# Patient Record
Sex: Male | Born: 1990 | Race: White | Hispanic: No | Marital: Single | Smoking: Never smoker
Health system: Southern US, Community
[De-identification: ages and names within clinical notes are randomized; demographics above are authoritative.]

## PROBLEM LIST (undated history)

## (undated) HISTORY — PX: OTHER SURGICAL HISTORY: SHX169

---

## 2014-03-19 ENCOUNTER — Emergency Department (HOSPITAL_COMMUNITY): Payer: BC Managed Care – PPO

## 2014-03-19 ENCOUNTER — Encounter (HOSPITAL_COMMUNITY): Payer: Self-pay | Admitting: Emergency Medicine

## 2014-03-19 ENCOUNTER — Ambulatory Visit (INDEPENDENT_AMBULATORY_CARE_PROVIDER_SITE_OTHER): Payer: BC Managed Care – PPO | Admitting: Emergency Medicine

## 2014-03-19 ENCOUNTER — Inpatient Hospital Stay (HOSPITAL_COMMUNITY)
Admission: EM | Admit: 2014-03-19 | Discharge: 2014-03-22 | DRG: 699 | Disposition: A | Discharge status: Home / Self Care | Payer: BC Managed Care – PPO | Attending: General Surgery | Admitting: General Surgery

## 2014-03-19 VITALS — BP 108/68 | HR 66 | Temp 97.4°F | Resp 16 | Ht 67.5 in | Wt 147.0 lb

## 2014-03-19 DIAGNOSIS — R1012 Left upper quadrant pain: Secondary | ICD-10-CM

## 2014-03-19 DIAGNOSIS — S37009A Unspecified injury of unspecified kidney, initial encounter: Secondary | ICD-10-CM

## 2014-03-19 DIAGNOSIS — S37032A Laceration of left kidney, unspecified degree, initial encounter: Secondary | ICD-10-CM

## 2014-03-19 DIAGNOSIS — R319 Hematuria, unspecified: Secondary | ICD-10-CM | POA: Diagnosis present

## 2014-03-19 DIAGNOSIS — Y9379 Activity, other specified sports and athletics: Secondary | ICD-10-CM

## 2014-03-19 DIAGNOSIS — D62 Acute posthemorrhagic anemia: Secondary | ICD-10-CM

## 2014-03-19 DIAGNOSIS — S37039A Laceration of unspecified kidney, unspecified degree, initial encounter: Principal | ICD-10-CM

## 2014-03-19 DIAGNOSIS — D72829 Elevated white blood cell count, unspecified: Secondary | ICD-10-CM | POA: Diagnosis present

## 2014-03-19 DIAGNOSIS — W219XXA Striking against or struck by unspecified sports equipment, initial encounter: Secondary | ICD-10-CM

## 2014-03-19 LAB — I-STAT CHEM 8, ED
BUN: 16 mg/dL (ref 6–23)
CHLORIDE: 102 meq/L (ref 96–112)
Calcium, Ion: 1.22 mmol/L (ref 1.12–1.23)
Creatinine, Ser: 1 mg/dL (ref 0.50–1.35)
GLUCOSE: 93 mg/dL (ref 70–99)
HEMATOCRIT: 33 % — AB (ref 39.0–52.0)
Hemoglobin: 11.2 g/dL — ABNORMAL LOW (ref 13.0–17.0)
POTASSIUM: 4.1 meq/L (ref 3.7–5.3)
Sodium: 140 mEq/L (ref 137–147)
TCO2: 23 mmol/L (ref 0–100)

## 2014-03-19 LAB — POCT URINALYSIS DIPSTICK
GLUCOSE UA: NEGATIVE
Ketones, UA: 40
LEUKOCYTES UA: NEGATIVE
NITRITE UA: NEGATIVE
Protein, UA: >=300
Spec Grav, UA: 1.025
Urobilinogen, UA: 1
pH, UA: 6

## 2014-03-19 LAB — COMPREHENSIVE METABOLIC PANEL
ALK PHOS: 41 U/L (ref 39–117)
ALT: 11 U/L (ref 0–53)
AST: 19 U/L (ref 0–37)
Albumin: 4.1 g/dL (ref 3.5–5.2)
BILIRUBIN TOTAL: 0.6 mg/dL (ref 0.3–1.2)
BUN: 16 mg/dL (ref 6–23)
CHLORIDE: 103 meq/L (ref 96–112)
CO2: 23 meq/L (ref 19–32)
Calcium: 9 mg/dL (ref 8.4–10.5)
Creatinine, Ser: 0.98 mg/dL (ref 0.50–1.35)
GFR calc Af Amer: >90 mL/min (ref >90)
Glucose, Bld: 97 mg/dL (ref 70–99)
POTASSIUM: 4.2 meq/L (ref 3.7–5.3)
Sodium: 137 mEq/L (ref 137–147)
Total Protein: 6.4 g/dL (ref 6.0–8.3)

## 2014-03-19 LAB — POCT UA - MICROSCOPIC ONLY

## 2014-03-19 LAB — POCT CBC
Granulocyte percent: 87.6 %G — AB (ref 37–80)
HCT, POC: 38.4 % — AB (ref 43.5–53.7)
HEMOGLOBIN: 12.6 g/dL — AB (ref 14.1–18.1)
Lymph, poc: 1.6 (ref 0.6–3.4)
MCH, POC: 28.5 pg (ref 27–31.2)
MCHC: 32.8 g/dL (ref 31.8–35.4)
MCV: 86.8 fL (ref 80–97)
MID (cbc): 0.9 (ref 0–0.9)
MPV: 9.5 fL (ref 0–99.8)
POC GRANULOCYTE: 17 — AB (ref 2–6.9)
POC LYMPH %: 8 % — AB (ref 10–50)
POC MID %: 4.4 % (ref 0–12)
Platelet Count, POC: 258 10*3/uL (ref 142–424)
RBC: 4.42 M/uL — AB (ref 4.69–6.13)
RDW, POC: 13.5 %
WBC: 19.4 10*3/uL — AB (ref 4.6–10.2)

## 2014-03-19 LAB — CBC WITH DIFFERENTIAL/PLATELET
BASOS PCT: 0 % (ref 0–1)
Basophils Absolute: 0 10*3/uL (ref 0.0–0.1)
Eosinophils Absolute: 0 10*3/uL (ref 0.0–0.7)
Eosinophils Relative: 0 % (ref 0–5)
HCT: 33.6 % — ABNORMAL LOW (ref 39.0–52.0)
HEMOGLOBIN: 11.3 g/dL — AB (ref 13.0–17.0)
Lymphocytes Relative: 6 % — ABNORMAL LOW (ref 12–46)
Lymphs Abs: 0.9 10*3/uL (ref 0.7–4.0)
MCH: 28.5 pg (ref 26.0–34.0)
MCHC: 33.6 g/dL (ref 30.0–36.0)
MCV: 84.6 fL (ref 78.0–100.0)
MONOS PCT: 5 % (ref 3–12)
Monocytes Absolute: 0.8 10*3/uL (ref 0.1–1.0)
NEUTROS ABS: 14 10*3/uL — AB (ref 1.7–7.7)
NEUTROS PCT: 89 % — AB (ref 43–77)
Platelets: 201 10*3/uL (ref 150–400)
RBC: 3.97 MIL/uL — ABNORMAL LOW (ref 4.22–5.81)
RDW: 12.8 % (ref 11.5–15.5)
WBC: 15.8 10*3/uL — ABNORMAL HIGH (ref 4.0–10.5)

## 2014-03-19 MED ORDER — PANTOPRAZOLE SODIUM 40 MG IV SOLR
40.0000 mg | Freq: Every day | INTRAVENOUS | Status: DC
  Filled 2014-03-19: qty 40

## 2014-03-19 MED ORDER — ONDANSETRON HCL 4 MG PO TABS: 4.0000 mg | ORAL_TABLET | Freq: Four times a day (QID) | ORAL | Status: DC | PRN

## 2014-03-19 MED ORDER — PANTOPRAZOLE SODIUM 40 MG PO TBEC
40.0000 mg | DELAYED_RELEASE_TABLET | Freq: Every day | ORAL | Status: DC
  Administered 2014-03-20 – 2014-03-21 (×2): 40 mg via ORAL
  Filled 2014-03-19 (×2): qty 1

## 2014-03-19 MED ORDER — MORPHINE SULFATE 2 MG/ML IJ SOLN: 1.0000 mg | INTRAMUSCULAR | Status: DC | PRN

## 2014-03-19 MED ORDER — OXYCODONE HCL 5 MG PO TABS: 5.0000 mg | ORAL_TABLET | ORAL | Status: DC | PRN

## 2014-03-19 MED ORDER — SODIUM CHLORIDE 0.9 % IV SOLN
INTRAVENOUS | Status: DC
  Administered 2014-03-19 – 2014-03-20 (×2): via INTRAVENOUS

## 2014-03-19 MED ORDER — ACETAMINOPHEN 325 MG PO TABS: 650.0000 mg | ORAL_TABLET | ORAL | Status: DC | PRN

## 2014-03-19 MED ORDER — ONDANSETRON HCL 4 MG/2ML IJ SOLN: 4.0000 mg | Freq: Four times a day (QID) | INTRAMUSCULAR | Status: DC | PRN

## 2014-03-19 MED ORDER — OXYCODONE HCL 5 MG PO TABS: 10.0000 mg | ORAL_TABLET | ORAL | Status: DC | PRN

## 2014-03-19 MED ORDER — IOHEXOL 300 MG/ML  SOLN
100.0000 mL | Freq: Once | INTRAMUSCULAR | Status: AC | PRN
  Administered 2014-03-19: 100 mL via INTRAVENOUS

## 2014-03-19 NOTE — H&P (Signed)
History   Mitchell Hamilton is an 23 y.o. male.   Chief Complaint:  Chief Complaint  Patient presents with  . Flank Pain    Flank Pain This is a new problem. The current episode started today. The problem occurs constantly. The problem has been gradually improving. Associated symptoms include abdominal pain, nausea and urinary symptoms. Pertinent negatives include no chest pain, coughing, diaphoresis, fatigue, fever, headaches, myalgias, neck pain or vomiting. The symptoms are aggravated by walking, twisting and exertion. He has tried NSAIDs for the symptoms. The treatment provided no relief.   Patient was playing softball earlier today around 1 PM when another player collided into him. He was able to finish playing the game; however, he had a significant amount of left flank and left lower quadrant abdominal pain afterwards. He was unable to drive his car afterwards. His sister had to take him to her apartment. Despite rest and ibuprofen his symptoms have not improved. He felt nauseated but did not vomit. He started urinating frank blood. This prompted him to be taken to urgent care where he was evaluated and sent by EMS to the ER for further workup. He was found to have a grade 3-4 left renal laceration. He reports that his urine is no longer bright red but very dark. He also reported some lightheadedness and dizziness when he tried to stand up at his sister's apartment. They also report that he was pale at that time. He denies any loss of consciousness. He denies any chest pain or shortness of breath. He denies any lower extremity trauma. He states that the pain did radiate down his left leg.  History reviewed. No pertinent past medical history.  Past Surgical History  Procedure Laterality Date  . Ac      No family history on file. Social History:  reports that he has never smoked. He does not have any smokeless tobacco history on file. He reports that he does not drink alcohol or use illicit  drugs.  Allergies  No Known Allergies  Home Medications   (Not in a hospital admission)  Trauma Course   Results for orders placed during the hospital encounter of 03/19/14 (from the past 48 hour(s))  CBC WITH DIFFERENTIAL     Status: Abnormal   Collection Time    03/19/14  8:13 PM      Result Value Ref Range   WBC 15.8 (*) 4.0 - 10.5 K/uL   RBC 3.97 (*) 4.22 - 5.81 MIL/uL   Hemoglobin 11.3 (*) 13.0 - 17.0 g/dL   HCT 33.6 (*) 39.0 - 52.0 %   MCV 84.6  78.0 - 100.0 fL   MCH 28.5  26.0 - 34.0 pg   MCHC 33.6  30.0 - 36.0 g/dL   RDW 12.8  11.5 - 15.5 %   Platelets 201  150 - 400 K/uL   Neutrophils Relative % 89 (*) 43 - 77 %   Neutro Abs 14.0 (*) 1.7 - 7.7 K/uL   Lymphocytes Relative 6 (*) 12 - 46 %   Lymphs Abs 0.9  0.7 - 4.0 K/uL   Monocytes Relative 5  3 - 12 %   Monocytes Absolute 0.8  0.1 - 1.0 K/uL   Eosinophils Relative 0  0 - 5 %   Eosinophils Absolute 0.0  0.0 - 0.7 K/uL   Basophils Relative 0  0 - 1 %   Basophils Absolute 0.0  0.0 - 0.1 K/uL  COMPREHENSIVE METABOLIC PANEL     Status:  None   Collection Time    03/19/14  8:13 PM      Result Value Ref Range   Sodium 137  137 - 147 mEq/L   Potassium 4.2  3.7 - 5.3 mEq/L   Chloride 103  96 - 112 mEq/L   CO2 23  19 - 32 mEq/L   Glucose, Bld 97  70 - 99 mg/dL   BUN 16  6 - 23 mg/dL   Creatinine, Ser 0.98  0.50 - 1.35 mg/dL   Calcium 9.0  8.4 - 10.5 mg/dL   Total Protein 6.4  6.0 - 8.3 g/dL   Albumin 4.1  3.5 - 5.2 g/dL   AST 19  0 - 37 U/L   ALT 11  0 - 53 U/L   Alkaline Phosphatase 41  39 - 117 U/L   Total Bilirubin 0.6  0.3 - 1.2 mg/dL   GFR calc non Af Amer >90  >90 mL/min   GFR calc Af Amer >90  >90 mL/min   Comment: (NOTE)     The eGFR has been calculated using the CKD EPI equation.     This calculation has not been validated in all clinical situations.     eGFR's persistently <90 mL/min signify possible Chronic Kidney     Disease.  I-STAT CHEM 8, ED     Status: Abnormal   Collection Time    03/19/14   8:20 PM      Result Value Ref Range   Sodium 140  137 - 147 mEq/L   Potassium 4.1  3.7 - 5.3 mEq/L   Chloride 102  96 - 112 mEq/L   BUN 16  6 - 23 mg/dL   Creatinine, Ser 1.00  0.50 - 1.35 mg/dL   Glucose, Bld 93  70 - 99 mg/dL   Calcium, Ion 1.22  1.12 - 1.23 mmol/L   TCO2 23  0 - 100 mmol/L   Hemoglobin 11.2 (*) 13.0 - 17.0 g/dL   HCT 33.0 (*) 39.0 - 52.0 %   Ct Abdomen Pelvis W Contrast  03/19/2014   CLINICAL DATA:  Sports injury E. Collision. Left flank pain hematuria  EXAM: CT ABDOMEN AND PELVIS WITH CONTRAST  TECHNIQUE: Multidetector CT imaging of the abdomen and pelvis was performed using the standard protocol following bolus administration of intravenous contrast.  CONTRAST:  116mL OMNIPAQUE IOHEXOL 300 MG/ML  SOLN  COMPARISON:  None.  FINDINGS: Lung bases are clear.  No pericardial fluid.  There is a large perinephric hematoma primarily in the posterior perinephric space. This large perinephric hematoma elevates the left kidney. The hematoma measures 4.5 cm in depth posterior to the left kidney and extends from the iliac fossa the to the upper pole each kidney.  There is a laceration involving the pole of the left kidney (image 25, series 3). The laceration involves the anterior and posterior cortex. The posterior the laceration measures 2 cm. Anteriorly laceration measures 1.5 cm. No clear active extravasation identified.  Delayed imaging demonstrates no evidence of urine leek on the left.  The right kidney is normal. There is no evidence solid organ injury to the liver or spleen.  The abdominal aorta is normal. The left right renal arteries appear normal.  The bowel is unremarkable.  There is free fluid in the pelvis the posterior cul-de-sac simple fluid attenuation. Small mild hemorrhage a does extend into the left pelvis in the retroperitoneal space along the iliac fossa/obturator space (image 70, series 3. The bladder is intact.  Review of the bone windows demonstrates no evidence of  fracture.  IMPRESSION: 1. Grade 3 / 4 left renal laceration with a large perinephric hematoma elevating the kidney. 2. No clear evidence of active extravasation on exam 3. No evidence of injury to the left renal collecting system or ureter. 4. No additional evidence of solid organ injury or fracture.   Electronically Signed   By: Suzy Bouchard M.D.   On: 03/19/2014 20:15    Review of Systems  Constitutional: Negative for fever, weight loss, diaphoresis and fatigue.  HENT: Negative for ear discharge, ear pain, hearing loss and tinnitus.   Eyes: Negative for blurred vision, double vision, photophobia and pain.  Respiratory: Negative for cough, sputum production and shortness of breath.   Cardiovascular: Negative for chest pain.  Gastrointestinal: Positive for nausea and abdominal pain. Negative for vomiting.  Genitourinary: Positive for hematuria and flank pain. Negative for dysuria, urgency and frequency.  Musculoskeletal: Negative for back pain, falls, joint pain, myalgias and neck pain.  Neurological: Positive for dizziness. Negative for tingling, sensory change, focal weakness, loss of consciousness and headaches.  Endo/Heme/Allergies: Does not bruise/bleed easily.  Psychiatric/Behavioral: Negative for depression, memory loss and substance abuse. The patient is not nervous/anxious.     Blood pressure 111/64, pulse 59, temperature 98.4 F (36.9 C), temperature source Oral, resp. rate 20, SpO2 100.00%. Physical Exam  Vitals reviewed. Constitutional: He is oriented to person, place, and time. He appears well-developed and well-nourished. No distress.  HENT:  Head: Normocephalic and atraumatic.  Right Ear: External ear normal.  Left Ear: External ear normal.  Eyes: Conjunctivae are normal. No scleral icterus.  Neck: Normal range of motion. Neck supple. No tracheal deviation present. No thyromegaly present.  Cardiovascular: Normal rate, normal heart sounds and intact distal pulses.     Respiratory: Effort normal and breath sounds normal. No stridor. No respiratory distress. He has no wheezes.    GI: Soft. He exhibits no distension. There is tenderness in the left lower quadrant. There is CVA tenderness. There is no rebound and no guarding.    Musculoskeletal: He exhibits no edema and no tenderness.       Arms: MAE. FROM  Lymphadenopathy:    He has no cervical adenopathy.  Neurological: He is alert and oriented to person, place, and time. He exhibits normal muscle tone.  Skin: Skin is warm and dry. No abrasion, no bruising, no ecchymosis and no rash noted. He is not diaphoretic. No erythema. No pallor.  Psychiatric: He has a normal mood and affect. His behavior is normal. Judgment and thought content normal.     Assessment/Plan Blunt Left renal trauma Acute blood loss anemia Perinephric hematoma Hematuria Grade 3-4 Left renal laceration  Urology consult-appreciate Rec Admit to step down unit Serial hemoglobin Bed rest Type and screen Hold chemical VTE prophylaxis SCDs  Leighton Ruff. Redmond Pulling, MD, FACS General, Bariatric, & Minimally Invasive Surgery [facility] Surgery, [place]   [facility] M 03/19/2014, 9:48 PM   Procedures

## 2014-03-19 NOTE — ED Notes (Signed)
Pt to ED via [facility] from Urgent Care with c/o  Playing softball earlier today and colliding with another player.  St's approx 3 hours later when he urinated there was a lot of blood in it.  Pt went to Urgent Care for same.

## 2014-03-19 NOTE — Progress Notes (Signed)
[facility] [address], [place] [address] [phone]  Date:  03/19/2014   Name:  Mitchell Hamilton   DOB:  1991-01-20   MRN:  [id number]  PCP:  No PCP Per Patient    Chief Complaint: Hematuria, Nausea and Flank Pain   History of Present Illness:  Mitchell SmartMadison N Shenk is a 23 y.o. very pleasant male patient who presents with the following:  Injured today at [id number] when he collided with another player during a softball game.  He has developed pain in the left flank and left abdomen.  No nausea or vomiting. Has had blood in urine since collision and has cleared somewhat.  Feels dizzy and lightheaded.  He has no shortness of breath or chest pain. No cough or wheezing or shortness of breath. No improvement with over the counter medications or other home remedies. Denies other complaint or health concern today.   There are no active problems to display for this patient.   History reviewed. No pertinent past medical history.  Past Surgical History  Procedure Laterality Date  . Ac      History  Substance Use Topics  . Smoking status: Never Smoker   . Smokeless tobacco: Not on file  . Alcohol Use: No    History reviewed. No pertinent family history.  No Known Allergies  Medication list has been reviewed and updated.  No current outpatient prescriptions on file prior to visit.   No current facility-administered medications on file prior to visit.    Review of Systems: As per HPI, otherwise negative.    Physical Examination: Filed Vitals:   03/19/14 1740  BP: 108/68  Pulse: 66  Temp: 97.4 F (36.3 C)  Resp: 16   Filed Vitals:   03/19/14 1740  Height: 5' 7.5" (1.715 m)  Weight: 147 lb (66.679 kg)   Body mass index is 22.67 kg/(m^2). Ideal Body Weight: Weight in (lb) to have BMI = 25: 161.7  GEN: WDWN, ill appearing with pallid lips, Non-toxic, A & O x 3 HEENT: Atraumatic, Normocephalic. Neck supple. No masses, No LAD. Ears and Nose: No  external deformity. CV: RRR, No M/G/R. No JVD. No thrill. No extra heart sounds. PULM: CTA B, no wheezes, crackles, rhonchi. No retractions. No resp. distress. No accessory muscle use. ABD: S, tender abdomen on left flank.  No hematoma, ND, +BS. Guards left abdomen. No HSM. EXTR: No c/c/e NEURO Normal gait.  PSYCH: Normally interactive. Conversant. Not depressed or anxious appearing.  Calm demeanor.    Assessment and Plan: Abdominal pain Blunt abdominal trauma Hematuria To ER via EMS  Signed,  Phillips OdorJeffery Anderson, MD

## 2014-03-19 NOTE — ED Notes (Signed)
Dr. Andrey CampanileWilson in to assess pt for admission.  Family at bedside.

## 2014-03-19 NOTE — Consult Note (Signed)
Urology Consult  Referring physician: Dr Redmond Pulling Reason for referral: Renal trauma  Chief Complaint: Renal trauma  History of Present Illness: head on collision with softball and another runner today; voiding twice blood; no renal history; no LUTS; no UTI; mild left flank pain Modifying factors: There are no other modifying factors  Associated signs and symptoms: There are no other associated signs and symptoms Aggravating and relieving factors: There are no other aggravating or relieving factors Severity: Moderate Duration: Persistent  History reviewed. No pertinent past medical history. Past Surgical History  Procedure Laterality Date  . Ac      Medications: I have reviewed the patient's current medications. Allergies: No Known Allergies  No family history on file. Social History:  reports that he has never smoked. He does not have any smokeless tobacco history on file. He reports that he does not drink alcohol or use illicit drugs.  ROS: All systems are reviewed and negative except as noted. Rest negative  Physical Exam:  Vital signs in last 24 hours: Temp:  [97.4 F (36.3 C)-98.4 F (36.9 C)] 98.4 F (36.9 C) (06/20 1857) Pulse Rate:  [58-66] 59 (06/20 2045) Resp:  [16-20] 20 (06/20 1857) BP: (108-119)/(59-68) 111/64 mmHg (06/20 2045) SpO2:  [93 %-100 %] 100 % (06/20 2045) Weight:  [66.679 kg (147 lb)] 66.679 kg (147 lb) (06/20 1740)  Cardiovascular: Skin warm; not flushed Respiratory: Breaths quiet; no shortness of breath Abdomen: No masses Neurological: Normal sensation to touch Musculoskeletal: Normal motor function arms and legs Lymphatics: No inguinal adenopathy Skin: No rashes Genitourinary:left flank tender; non-toxic  Laboratory Data:  Results for orders placed during the hospital encounter of 03/19/14 (from the past 72 hour(s))  CBC WITH DIFFERENTIAL     Status: Abnormal   Collection Time    03/19/14  8:13 PM      Result Value Ref Range   WBC 15.8  (*) 4.0 - 10.5 K/uL   RBC 3.97 (*) 4.22 - 5.81 MIL/uL   Hemoglobin 11.3 (*) 13.0 - 17.0 g/dL   HCT 33.6 (*) 39.0 - 52.0 %   MCV 84.6  78.0 - 100.0 fL   MCH 28.5  26.0 - 34.0 pg   MCHC 33.6  30.0 - 36.0 g/dL   RDW 12.8  11.5 - 15.5 %   Platelets 201  150 - 400 K/uL   Neutrophils Relative % 89 (*) 43 - 77 %   Neutro Abs 14.0 (*) 1.7 - 7.7 K/uL   Lymphocytes Relative 6 (*) 12 - 46 %   Lymphs Abs 0.9  0.7 - 4.0 K/uL   Monocytes Relative 5  3 - 12 %   Monocytes Absolute 0.8  0.1 - 1.0 K/uL   Eosinophils Relative 0  0 - 5 %   Eosinophils Absolute 0.0  0.0 - 0.7 K/uL   Basophils Relative 0  0 - 1 %   Basophils Absolute 0.0  0.0 - 0.1 K/uL  COMPREHENSIVE METABOLIC PANEL     Status: None   Collection Time    03/19/14  8:13 PM      Result Value Ref Range   Sodium 137  137 - 147 mEq/L   Potassium 4.2  3.7 - 5.3 mEq/L   Chloride 103  96 - 112 mEq/L   CO2 23  19 - 32 mEq/L   Glucose, Bld 97  70 - 99 mg/dL   BUN 16  6 - 23 mg/dL   Creatinine, Ser 0.98  0.50 - 1.35 mg/dL  Calcium 9.0  8.4 - 10.5 mg/dL   Total Protein 6.4  6.0 - 8.3 g/dL   Albumin 4.1  3.5 - 5.2 g/dL   AST 19  0 - 37 U/L   ALT 11  0 - 53 U/L   Alkaline Phosphatase 41  39 - 117 U/L   Total Bilirubin 0.6  0.3 - 1.2 mg/dL   GFR calc non Af Amer >90  >90 mL/min   GFR calc Af Amer >90  >90 mL/min   Comment: (NOTE)     The eGFR has been calculated using the CKD EPI equation.     This calculation has not been validated in all clinical situations.     eGFR's persistently <90 mL/min signify possible Chronic Kidney     Disease.  I-STAT CHEM 8, ED     Status: Abnormal   Collection Time    03/19/14  8:20 PM      Result Value Ref Range   Sodium 140  137 - 147 mEq/L   Potassium 4.1  3.7 - 5.3 mEq/L   Chloride 102  96 - 112 mEq/L   BUN 16  6 - 23 mg/dL   Creatinine, Ser 1.00  0.50 - 1.35 mg/dL   Glucose, Bld 93  70 - 99 mg/dL   Calcium, Ion 1.22  1.12 - 1.23 mmol/L   TCO2 23  0 - 100 mmol/L   Hemoglobin 11.2 (*) 13.0 - 17.0  g/dL   HCT 33.0 (*) 39.0 - 52.0 %   No results found for this or any previous visit (from the past 240 hour(s)). Creatinine:  Recent Labs  03/19/14 2013 03/19/14 2020  CREATININE 0.98 1.00    Xrays: See report/chart CT scan demonstrates a large hematoma; fracture of kidney into sinus but no extravasation and rest of kidney looks good  Impression/Assessment:  Left renal trauma  Plan:  Observation; serial Hb; follow in am; reduce activity 12 weeks  MACDIARMID,SCOTT A 03/19/2014, 9:22 PM

## 2014-03-19 NOTE — ED Provider Notes (Addendum)
CSN: [id number]     Arrival date & time 03/19/14  1845 History   First MD Initiated Contact with Patient 03/19/14 1900     Chief Complaint  Patient presents with  . Flank Pain     (Consider location/radiation/quality/duration/timing/severity/associated sxs/prior Treatment) HPI Comments: Seen at urgent care and noted to have blood in urine and Hb of 12  Patient is a 23 y.o. male presenting with flank pain. The history is provided by the patient.  Flank Pain This is a new problem. Episode onset: 6 hours after colliding with another player on the softball field. The problem occurs constantly. The problem has been gradually improving. Associated symptoms include abdominal pain. Pertinent negatives include no shortness of breath. Associated symptoms comments: Feels dizzy when getting up and trying to walk.  Also urinating blood x2.. The symptoms are aggravated by walking and standing. The symptoms are relieved by lying down. He has tried rest for the symptoms. The treatment provided no relief.    No past medical history on file. Past Surgical History  Procedure Laterality Date  . Ac     No family history on file. History  Substance Use Topics  . Smoking status: Never Smoker   . Smokeless tobacco: Not on file  . Alcohol Use: No    Review of Systems  Respiratory: Negative for shortness of breath.   Gastrointestinal: Positive for abdominal pain.  Genitourinary: Positive for flank pain.  All other systems reviewed and are negative.     Allergies  Review of patient's allergies indicates no known allergies.  Home Medications   Prior to Admission medications   Not on File   BP 119/68  Pulse 66  Temp(Src) 98.4 F (36.9 C) (Oral)  Resp 20  SpO2 99% Physical Exam  Nursing note and vitals reviewed. Constitutional: He is oriented to person, place, and time. He appears well-developed and well-nourished. No distress.  HENT:  Head: Normocephalic and atraumatic.  Mouth/Throat:  Oropharynx is clear and moist.  Eyes: Conjunctivae and EOM are normal. Pupils are equal, round, and reactive to light.  Neck: Normal range of motion. Neck supple.  Cardiovascular: Normal rate, regular rhythm and intact distal pulses.   No murmur heard. Pulmonary/Chest: Effort normal and breath sounds normal. No respiratory distress. He has no wheezes. He has no rales.  Abdominal: Soft. He exhibits no distension. There is tenderness in the left upper quadrant and left lower quadrant. There is guarding and CVA tenderness. There is no rebound.  Mild left CVA tenderness  Musculoskeletal: Normal range of motion. He exhibits no edema and no tenderness.  Neurological: He is alert and oriented to person, place, and time.  Skin: Skin is warm and dry. No rash noted. No erythema.  Psychiatric: He has a normal mood and affect. His behavior is normal.    ED Course  Procedures (including critical care time) Labs Review Labs Reviewed  I-STAT CHEM 8, ED - Abnormal; Notable for the following:    Hemoglobin 11.2 (*)    HCT 33.0 (*)    All other components within normal limits  CBC WITH DIFFERENTIAL  COMPREHENSIVE METABOLIC PANEL    Imaging Review Ct Abdomen Pelvis W Contrast  03/19/2014   CLINICAL DATA:  Sports injury E. Collision. Left flank pain hematuria  EXAM: CT ABDOMEN AND PELVIS WITH CONTRAST  TECHNIQUE: Multidetector CT imaging of the abdomen and pelvis was performed using the standard protocol following bolus administration of intravenous contrast.  CONTRAST:  100mL OMNIPAQUE IOHEXOL 300 MG/ML  SOLN  COMPARISON:  None.  FINDINGS: Lung bases are clear.  No pericardial fluid.  There is a large perinephric hematoma primarily in the posterior perinephric space. This large perinephric hematoma elevates the left kidney. The hematoma measures 4.5 cm in depth posterior to the left kidney and extends from the iliac fossa the to the upper pole each kidney.  There is a laceration involving the pole of the  left kidney (image 25, series 3). The laceration involves the anterior and posterior cortex. The posterior the laceration measures 2 cm. Anteriorly laceration measures 1.5 cm. No clear active extravasation identified.  Delayed imaging demonstrates no evidence of urine leek on the left.  The right kidney is normal. There is no evidence solid organ injury to the liver or spleen.  The abdominal aorta is normal. The left right renal arteries appear normal.  The bowel is unremarkable.  There is free fluid in the pelvis the posterior cul-de-sac simple fluid attenuation. Small mild hemorrhage a does extend into the left pelvis in the retroperitoneal space along the iliac fossa/obturator space (image 70, series 3. The bladder is intact.  Review of the bone windows demonstrates no evidence of fracture.  IMPRESSION: 1. Grade 3 / 4 left renal laceration with a large perinephric hematoma elevating the kidney. 2. No clear evidence of active extravasation on exam 3. No evidence of injury to the left renal collecting system or ureter. 4. No additional evidence of solid organ injury or fracture.   Electronically Signed   By: Genevive BiStewart  Edmunds M.D.   On: 03/19/2014 20:15     EKG Interpretation None      MDM   Final diagnoses:  Kidney laceration, left, initial encounter    Patient here after collision on the softball field approximately 6 hours ago and he is persistent left-sided abdominal pain and 2 episodes of urinating blood. He was seen at urgent care had improved to numerous to count red blood cells and hemoglobin of 12 at urgent care. Is here for further evaluation. Patient is hemodynamically stable but does have significant left upper and lower quadrant tenderness with mild left CVA tenderness. Chem 8 and repeat hemoglobin since to commands. CT of the abdomen and pelvis pending.  Currently patient does not want any IV pain meds  8:21 PM Ct showed a 3/4 kidney laceration.  Remains HD stable.  Will discuss with  trauma.  Cr wnl. And hb recheck here 11.2  8:44 PM Discussed with Dr. Andrey CampanileWilson who will admit the pt and will consult urology.   Gwyneth SproutWhitney Takima [place], MD 03/19/14 2029  Gwyneth SproutWhitney Kamaryn Grimley, MD 03/19/14 [id number]  Gwyneth SproutWhitney Shalondra Wunschel, MD 03/19/14 [id number]

## 2014-03-19 NOTE — ED Provider Notes (Signed)
2050 - Spoke with Dr. Sherron MondayMacDiarmid of [facility]. Dr. Sherron MondayMacDiarmid to see and evaluate patient, as requested by Trauma Surgery service. VSS since arrival.   Filed Vitals:   03/19/14 1857 03/19/14 1900 03/19/14 1915  BP: 119/68 109/63 113/59  Pulse: 66 64 58  Temp: 98.4 F (36.9 C)    TempSrc: Oral    Resp: 20    SpO2: 99% 100% 100%     Antony MaduraKelly Humes, PA-C 03/19/14 2055

## 2014-03-20 LAB — CBC
HCT: 31.3 % — ABNORMAL LOW (ref 39.0–52.0)
HCT: 31.6 % — ABNORMAL LOW (ref 39.0–52.0)
HEMATOCRIT: 30.9 % — AB (ref 39.0–52.0)
HEMOGLOBIN: 10.5 g/dL — AB (ref 13.0–17.0)
HEMOGLOBIN: 10.5 g/dL — AB (ref 13.0–17.0)
Hemoglobin: 10.7 g/dL — ABNORMAL LOW (ref 13.0–17.0)
MCH: 28.2 pg (ref 26.0–34.0)
MCH: 28.5 pg (ref 26.0–34.0)
MCH: 28.5 pg (ref 26.0–34.0)
MCHC: 33.5 g/dL (ref 30.0–36.0)
MCHC: 34 g/dL (ref 30.0–36.0)
MCHC: 33.9 g/dL (ref 30.0–36.0)
MCV: 84 fL (ref 78.0–100.0)
MCV: 84 fL (ref 78.0–100.0)
MCV: 84.1 fL (ref 78.0–100.0)
PLATELETS: 179 10*3/uL (ref 150–400)
PLATELETS: 189 10*3/uL (ref 150–400)
Platelets: 171 10*3/uL (ref 150–400)
RBC: 3.68 MIL/uL — AB (ref 4.22–5.81)
RBC: 3.72 MIL/uL — AB (ref 4.22–5.81)
RBC: 3.76 MIL/uL — ABNORMAL LOW (ref 4.22–5.81)
RDW: 12.8 % (ref 11.5–15.5)
RDW: 12.8 % (ref 11.5–15.5)
RDW: 12.8 % (ref 11.5–15.5)
WBC: 11.4 10*3/uL — AB (ref 4.0–10.5)
WBC: 13.2 10*3/uL — AB (ref 4.0–10.5)
WBC: 9.4 10*3/uL (ref 4.0–10.5)

## 2014-03-20 LAB — COMPREHENSIVE METABOLIC PANEL
ALT: 12 U/L (ref 0–53)
AST: 20 U/L (ref 0–37)
Albumin: 3.9 g/dL (ref 3.5–5.2)
Alkaline Phosphatase: 39 U/L (ref 39–117)
BUN: 14 mg/dL (ref 6–23)
CALCIUM: 9 mg/dL (ref 8.4–10.5)
CO2: 23 meq/L (ref 19–32)
CREATININE: 0.94 mg/dL (ref 0.50–1.35)
Chloride: 101 mEq/L (ref 96–112)
GLUCOSE: 108 mg/dL — AB (ref 70–99)
Potassium: 3.9 mEq/L (ref 3.7–5.3)
Sodium: 138 mEq/L (ref 137–147)
Total Bilirubin: 0.6 mg/dL (ref 0.3–1.2)
Total Protein: 6.4 g/dL (ref 6.0–8.3)

## 2014-03-20 LAB — TYPE AND SCREEN
ABO/RH(D): A POS
ANTIBODY SCREEN: NEGATIVE

## 2014-03-20 LAB — PROTIME-INR
INR: 1.13 (ref 0.00–1.49)
PROTHROMBIN TIME: 14.3 s (ref 11.6–15.2)

## 2014-03-20 LAB — APTT: APTT: 28 s (ref 24–37)

## 2014-03-20 LAB — MRSA PCR SCREENING: MRSA BY PCR: NEGATIVE

## 2014-03-20 LAB — ABO/RH: ABO/RH(D): A POS

## 2014-03-20 MED ORDER — DIPHENHYDRAMINE HCL 25 MG PO CAPS
25.0000 mg | ORAL_CAPSULE | Freq: Once | ORAL | Status: AC
  Administered 2014-03-20: 25 mg via ORAL
  Filled 2014-03-20: qty 1

## 2014-03-20 MED ORDER — DOCUSATE SODIUM 100 MG PO CAPS
100.0000 mg | ORAL_CAPSULE | Freq: Two times a day (BID) | ORAL | Status: DC
  Administered 2014-03-20 – 2014-03-21 (×2): 100 mg via ORAL
  Filled 2014-03-20 (×3): qty 1

## 2014-03-20 MED ORDER — MORPHINE SULFATE 2 MG/ML IJ SOLN: 1.0000 mg | INTRAMUSCULAR | Status: DC | PRN

## 2014-03-20 NOTE — Progress Notes (Signed)
[facility] Tra[facility]  Progress Note   LOS: 1 day   Subjective: Nice young man.  Less abdominal pain.  Notes urine is still dark, but no more frank blood.  No BM yet, urinating well.  Tolerating some full liquids, but not hungry.  On bed rest.  Objective: Vital signs in last 24 hours: Temp:  [97.4 F (36.3 C)-98.4 F (36.9 C)] 98.4 F (36.9 C) (06/21 0323) Pulse Rate:  [49-66] 50 (06/21 0323) Resp:  [7-20] 7 (06/21 0323) BP: (108-131)/(52-69) 126/63 mmHg (06/21 0323) SpO2:  [93 %-100 %] 100 % (06/21 0323) Weight:  [147 lb (66.679 kg)-151 lb 3.8 oz (68.6 kg)] 151 lb 3.8 oz (68.6 kg) (06/20 2233)    Lab Results:  CBC  Recent Labs  03/19/14 2013 03/19/14 2020 03/20/14 0040  WBC 15.8*  --  13.2*  HGB 11.3* 11.2* 10.7*  HCT 33.6* 33.0* 31.6*  PLT 201  --  189   BMET  Recent Labs  03/19/14 2013 03/19/14 2020 03/20/14 0040  NA 137 140 138  K 4.2 4.1 3.9  CL 103 102 101  CO2 23  --  23  GLUCOSE 97 93 108*  BUN 16 16 14   CREATININE 0.98 1.00 0.94  CALCIUM 9.0  --  9.0    Imaging: Ct Abdomen Pelvis W Contrast  03/19/2014   CLINICAL DATA:  Sports injury E. Collision. Left flank pain hematuria  EXAM: CT ABDOMEN AND PELVIS WITH CONTRAST  TECHNIQUE: Multidetector CT imaging of the abdomen and pelvis was performed using the standard protocol following bolus administration of intravenous contrast.  CONTRAST:  100mL OMNIPAQUE IOHEXOL 300 MG/ML  SOLN  COMPARISON:  None.  FINDINGS: Lung bases are clear.  No pericardial fluid.  There is a large perinephric hematoma primarily in the posterior perinephric space. This large perinephric hematoma elevates the left kidney. The hematoma measures 4.5 cm in depth posterior to the left kidney and extends from the iliac fossa the to the upper pole each kidney.  There is a laceration involving the pole of the left kidney (image 25, series 3). The laceration involves the anterior and posterior cortex. The posterior the  laceration measures 2 cm. Anteriorly laceration measures 1.5 cm. No clear active extravasation identified.  Delayed imaging demonstrates no evidence of urine leek on the left.  The right kidney is normal. There is no evidence solid organ injury to the liver or spleen.  The abdominal aorta is normal. The left right renal arteries appear normal.  The bowel is unremarkable.  There is free fluid in the pelvis the posterior cul-de-sac simple fluid attenuation. Small mild hemorrhage a does extend into the left pelvis in the retroperitoneal space along the iliac fossa/obturator space (image 70, series 3. The bladder is intact.  Review of the bone windows demonstrates no evidence of fracture.  IMPRESSION: 1. Grade 3 / 4 left renal laceration with a large perinephric hematoma elevating the kidney. 2. No clear evidence of active extravasation on exam 3. No evidence of injury to the left renal collecting system or ureter. 4. No additional evidence of solid organ injury or fracture.   Electronically Signed   By: Genevive BiStewart  Edmunds M.D.   On: 03/19/2014 20:15     PE: General: pleasant, WD/WN white male who is laying in bed in NAD HEENT: head is normocephalic, atraumatic.  Sclera are noninjected.  PERRL.  Ears and nose without any masses or lesions.  Mouth is pink and moist Heart: regular, rate, and rhythm.  Normal s1,s2. No obvious murmurs, gallops, or rubs noted.   Lungs: CTAB, no wheezes, rhonchi, or rales noted.  Respiratory effort nonlabored Abd: soft, tender on left abdomen, +BS, no masses, hernias, or organomegaly, no ecchymosis MS: all 4 extremities are symmetrical with no cyanosis, clubbing, or edema. Skin: warm and dry with no masses, lesions, or rashes Psych: A&Ox3 with an appropriate affect.   Assessment/Plan: Blunt closed renal trauma - Dr. 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

## 2014-03-20 NOTE — Progress Notes (Signed)
Minimal pain Looks good Urine dark  Hb normal Send home when OK with trauma Gave pt f/up info and activity level

## 2014-03-20 NOTE — Progress Notes (Signed)
Urine is now clear. Bedrest today. He plays [place] [employer]. Will need to clarify with urology timing of return to FB workouts at [place]/[employer]. I spoke with his mother. Patient examined and I agree with the assessment and plan  Violeta GelinasBurke Thompson, MD, MPH, FACS Trauma: [phone] General Surgery: [phone]  03/20/2014 11:37 AM

## 2014-03-20 NOTE — ED Provider Notes (Signed)
Medical screening examination/treatment/procedure(s) were conducted as a shared visit with non-physician practitioner(s) and myself.  I personally evaluated the patient during the encounter.   EKG Interpretation None        Gwyneth SproutWhitney Plunkett, MD 03/20/14 1103

## 2014-03-21 LAB — CBC
HEMATOCRIT: 30.2 % — AB (ref 39.0–52.0)
HEMATOCRIT: 30.6 % — AB (ref 39.0–52.0)
HEMATOCRIT: 31.4 % — AB (ref 39.0–52.0)
HEMOGLOBIN: 10.3 g/dL — AB (ref 13.0–17.0)
HEMOGLOBIN: 10.9 g/dL — AB (ref 13.0–17.0)
Hemoglobin: 10.5 g/dL — ABNORMAL LOW (ref 13.0–17.0)
MCH: 28.3 pg (ref 26.0–34.0)
MCH: 28.6 pg (ref 26.0–34.0)
MCH: 29 pg (ref 26.0–34.0)
MCHC: 33.7 g/dL (ref 30.0–36.0)
MCHC: 34.7 g/dL (ref 30.0–36.0)
MCHC: 34.8 g/dL (ref 30.0–36.0)
MCV: 83.5 fL (ref 78.0–100.0)
MCV: 82.3 fL (ref 78.0–100.0)
MCV: 84.1 fL (ref 78.0–100.0)
Platelets: 182 10*3/uL (ref 150–400)
Platelets: 183 10*3/uL (ref 150–400)
Platelets: 185 10*3/uL (ref 150–400)
RBC: 3.64 MIL/uL — ABNORMAL LOW (ref 4.22–5.81)
RBC: 3.67 MIL/uL — ABNORMAL LOW (ref 4.22–5.81)
RBC: 3.76 MIL/uL — ABNORMAL LOW (ref 4.22–5.81)
RDW: 12.3 % (ref 11.5–15.5)
RDW: 12.4 % (ref 11.5–15.5)
RDW: 12.6 % (ref 11.5–15.5)
WBC: 10.5 10*3/uL (ref 4.0–10.5)
WBC: 11.3 10*3/uL — ABNORMAL HIGH (ref 4.0–10.5)
WBC: 8.7 10*3/uL (ref 4.0–10.5)

## 2014-03-21 MED ORDER — MORPHINE SULFATE 2 MG/ML IJ SOLN: 2.0000 mg | INTRAMUSCULAR | Status: DC | PRN

## 2014-03-21 NOTE — Progress Notes (Signed)
Spoke with Earney HamburgMichael Jeffrey PA regarding pts urine color. Was clear yesterday and became pink to amber overnight and then back to clear this morning. He said he is ok with this and it is to be expected.

## 2014-03-21 NOTE — Progress Notes (Signed)
2nd attempt to call report at 1141. Awaiting call back.

## 2014-03-21 NOTE — Progress Notes (Signed)
UR completed.  Jaquavian Firkus, RN BSN MHA CCM Trauma/Neuro ICU Case Manager [phone]  

## 2014-03-21 NOTE — Progress Notes (Signed)
Report called to Marcha SoldersBetty Joe RN on 6N that will be receiving pt at 1149.

## 2014-03-21 NOTE — Progress Notes (Signed)
Pt transferred to 6N28. Pt states he will notify his family.

## 2014-03-21 NOTE — Progress Notes (Signed)
Attempted to call report to the nurse receiving pt on 6N28 at 1119. Awaiting call back.

## 2014-03-21 NOTE — Progress Notes (Signed)
Patient ID: Mitchell Hamilton, male   DOB: 03-16-1991, 23 y.o.   MRN: [id number]   LOS: 2 days   Subjective: No new c/o. Pain minimal.   Objective: Vital signs in last 24 hours: Temp:  [98 F (36.7 C)-98.4 F (36.9 C)] 98 F (36.7 C) (06/22 0319) Pulse Rate:  [48-72] 55 (06/22 0756) Resp:  [10-19] 15 (06/22 0756) BP: (114-130)/(55-76) 116/63 mmHg (06/22 0756) SpO2:  [98 %-100 %] 100 % (06/22 0756)    Laboratory  CBC  Recent Labs  03/21/14 03/21/14 0735  WBC 11.3* 10.5  HGB 10.3* 10.9*  HCT 30.6* 31.4*  PLT 182 183    Physical Exam General appearance: alert and no distress Resp: clear to auscultation bilaterally Cardio: regular rate and rhythm GI: normal findings: bowel sounds normal and soft, non-tender   Assessment/Plan: Blunt retroperitoneal trauma  Grade 3-4 left renal laceration -- Dr. Sherron MondayMacDiarmid following from urology  ABL anemia - Mild, stable FEN - Increase diet, SL IV VTE - SCD's, no pharm dvt proph  Dispo -- Will allow to ambulate, transfer to floor, possibly home tomorrow if hgb remains stable    Freeman CaldronMichael J. Jeffery, PA-C Pager: [phone] General Trauma PA Pager: [phone]  03/21/2014

## 2014-03-22 ENCOUNTER — Encounter (INDEPENDENT_AMBULATORY_CARE_PROVIDER_SITE_OTHER): Payer: Self-pay | Admitting: Orthopedic Surgery

## 2014-03-22 ENCOUNTER — Encounter (HOSPITAL_COMMUNITY): Payer: Self-pay

## 2014-03-22 DIAGNOSIS — D62 Acute posthemorrhagic anemia: Secondary | ICD-10-CM | POA: Diagnosis not present

## 2014-03-22 DIAGNOSIS — Y9379 Activity, other specified sports and athletics: Secondary | ICD-10-CM

## 2014-03-22 LAB — CBC
HEMATOCRIT: 31.4 % — AB (ref 39.0–52.0)
Hemoglobin: 10.9 g/dL — ABNORMAL LOW (ref 13.0–17.0)
MCH: 28.6 pg (ref 26.0–34.0)
MCHC: 34.7 g/dL (ref 30.0–36.0)
MCV: 82.4 fL (ref 78.0–100.0)
Platelets: 198 10*3/uL (ref 150–400)
RBC: 3.81 MIL/uL — ABNORMAL LOW (ref 4.22–5.81)
RDW: 12.4 % (ref 11.5–15.5)
WBC: 12.5 10*3/uL — ABNORMAL HIGH (ref 4.0–10.5)

## 2014-03-22 MED ORDER — ACETAMINOPHEN 325 MG PO TABS: 650.0000 mg | ORAL_TABLET | ORAL | Status: AC | PRN

## 2014-03-22 NOTE — Discharge Instructions (Signed)
No running, jumping, ball or contact sports, bikes, skateboards, etc until cleared by Dr. Sherron MondayMacDiarmid.

## 2014-03-22 NOTE — Discharge Summary (Signed)
Nusaybah Ivie, MD, MPH, FACS Trauma: [phone] General Surgery: [phone]  

## 2014-03-22 NOTE — Progress Notes (Signed)
Urology F/U will clarify when he may return to football. Violeta GelinasBurke Thompson, MD, MPH, FACS Trauma: [phone] General Surgery: [phone]

## 2014-03-22 NOTE — Progress Notes (Signed)
Patient ID: Mitchell Hamilton, male   DOB: 1991/08/25, 23 y.o.   MRN: [id number]   LOS: 3 days   Subjective: No new c/o. Ready to go home.   Objective: Vital signs in last 24 hours: Temp:  [97.9 F (36.6 C)-98.5 F (36.9 C)] 98.2 F (36.8 C) (06/23 0554) Pulse Rate:  [58-69] 58 (06/23 0554) Resp:  [16] 16 (06/23 0554) BP: (115-138)/(61-79) 115/61 mmHg (06/23 0554) SpO2:  [100 %] 100 % (06/23 0554) Last BM Date: 03/21/14   Laboratory  CBC  Recent Labs  03/21/14 1722 03/22/14 0511  WBC 8.7 12.5*  HGB 10.5* 10.9*  HCT 30.2* 31.4*  PLT 185 198    Physical Exam General appearance: alert and no distress Resp: clear to auscultation bilaterally Cardio: regular rate and rhythm GI: normal findings: bowel sounds normal and soft, non-tender   Assessment/Plan: Blunt retroperitoneal trauma  Grade 3-4 left renal laceration -- Dr. Sherron MondayMacDiarmid following from urology  ABL anemia - Mild, stable  Dispo -- Home today    Freeman CaldronMichael J. Jeffery, PA-C Pager: [phone] General Trauma PA Pager: [phone]  03/22/2014

## 2014-03-22 NOTE — Progress Notes (Signed)
Reviewed with pt and pt's mother discharge instructions. Pt to follow up with urology. Pt's mother reported that pt already has appointment scheduled for today. Pt reports minimal pain and that he doesn't like to take pain medications but would if needed. Reviewed restrictions with pt and pt's mother. Pt reports he will take it easy so that he can heal. Pt and pt's mother appreciative. Pt was given note for school by PA since pt missed an exam/class for summer session. Pt ready for discharge.

## 2014-03-22 NOTE — Clinical Social Work Note (Signed)
Clinical Social Work Department BRIEF PSYCHOSOCIAL ASSESSMENT 03/22/2014  Patient:  Mitchell Hamilton, Mitchell Hamilton     Account Number:  [id number]     [place] date:  03/19/2014  Clinical Social Worker:  Myles Lipps  Date/Time:  03/21/2014 04:00 PM  Referred by:  Physician  Date Referred:  03/21/2014 Referred for  Psychosocial assessment  Other - See comment   Other Referral:   Possible school needs   Interview type:  Patient Other interview type:   Patient mother at bedside    PSYCHOSOCIAL DATA Living Status:  FAMILY Admitted from facility:   Level of care:   Primary support name:  Mitchell Hamilton  [phone] Primary support relationship to patient:  PARENT Degree of support available:   Strong and supportive    CURRENT CONCERNS Current Concerns  None Noted   Other Concerns:    SOCIAL WORK ASSESSMENT / PLAN Clinical Social Worker met with patient and patient mother at bedside to offer support and discuss patient needs at discharge.  Patient states that he was playing softball when he collided with another person.  Patient attempted to return to his sister's house following the game but continued to feel worse.  Patient was brought to urgent care by his sister and then brought by EMS to ER.  Patient currently lives in [place] with his father and plans to return at discharge.  Patient is attending summer school while completing football workouts at [employer].  Patient understanding that he will not be cleared for return to football right away.  Patient mother has emailed patient advisor at the school to address patient absence and missed assignments.  Patient mother to update CSW if new needs arise.    Clinical Social Worker inquired about current substance use.  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 Patient mother with no additional concerns.  SBIRT complete.  No resources needed at this time.  CSW signing off.   Please reconsult if further needs arise.   Assessment/plan status:  No Further Intervention Required Other assessment/ plan:   Information/referral to community resources:   Clinical Social Worker offered patient documentation for school, however patient feels discharge paperwork will be sufficient when he returns.  Patient to update advisor and notify CSW if needs arise.    PATIENT'S/FAMILY'S RESPONSE TO PLAN OF CARE: Patient alert and oriented x3 laying in bed.  Patient states that he has a good support system at home and is anxious to return to [employer].  Patient verbalized concerns regarding continued blood in his urine - CSW updated RN for further explanation to patient.  Patient family agreeable with patient return home and plan to offer assistance and support as needed.  Patient verbalized understanding of social work role and appreciation for support and concern.

## 2014-03-22 NOTE — Discharge Summary (Signed)
Physician Discharge Summary  Patient ID: Milda SmartMadison N Hatchell MRN: [id number] DOB/AGE: 21/09/1990 22 y.o.  Admit date: 03/19/2014 Discharge date: 03/22/2014  Discharge Diagnoses Patient Active Problem List   Diagnosis Date Noted  . Sports accident 03/22/2014  . Acute blood loss anemia 03/22/2014  . Closed kidney laceration 03/19/2014    Consultants Dr. Lorin PicketScott MacDiarmid for urology   Procedures None   HPI: Wyn ForsterMadison was playing softball when another player collided into him. He was able to finish playing the game; however, he had a significant amount of left flank and left lower quadrant abdominal pain afterwards. He was unable to drive his car afterwards. His sister had to take him to her apartment. Despite rest and ibuprofen his symptoms did not improve. He felt nauseated but did not vomit. He started urinating frank blood. This prompted him to be taken to urgent care where he was evaluated and sent by EMS to the ER for further workup. He was found to have a grade 3-4 left renal laceration. He was admitted by the trauma service and urology was consulted.   Hospital Course: The patient was kept on bedrest for 2 days and then allowed to ambulate once is was clear his hemoglobin remained stable. He had minimal pain and no difficulty urinating though he continued to have hematuria. He was able to mobilize without difficulty and was discharged home in good condition in the care of his mother.      Medication List    STOP taking these medications       ibuprofen 200 MG tablet  Commonly known as:  ADVIL,MOTRIN      TAKE these medications       acetaminophen 325 MG tablet  Commonly known as:  TYLENOL  Take 2 tablets (650 mg total) by mouth every 4 (four) hours as needed (Pain).             Follow-up Information   Schedule an appointment as soon as possible for a visit with MACDIARMID,SCOTT A, MD.   Specialty:  Urology   Contact information:   [address] [place]  [address] [phone]       Call [facility]. (As needed)    Contact information:   [address] [place] [address] [phone]       Signed: Freeman CaldronMichael J. Anees Vanecek, PA-C Pager: [phone] General Trauma PA Pager: [phone] 03/22/2014, 8:34 AM

## 2015-02-11 IMAGING — CT CT ABD-PELV W/ CM
2 of 5 series · 16 of 46 positions shown, 18 images · IV contrast (omnipaque)
Comparison: None.

CLINICAL DATA: Sports injury E. Collision. Left flank pain
hematuria

EXAM:
CT ABDOMEN AND PELVIS WITH CONTRAST
TECHNIQUE: Multidetector CT imaging of the abdomen and pelvis was performed
using the standard protocol following bolus administration of
intravenous contrast.
CONTRAST:  100mL OMNIPAQUE IOHEXOL 300 MG/ML  SOLN

[Series 3: abd/ pelvis 5.0 i30f 1 · axial · 0.64mm/px · z∈[+439,+849]mm · 13 of 94 slices shown, 15 images]
[im 6/94  soft-tissue]
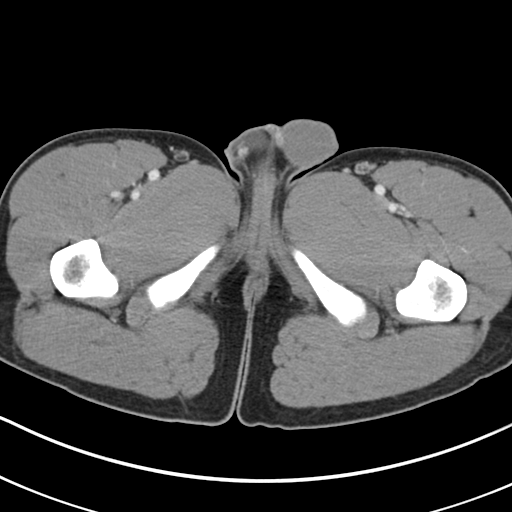
[im 6/94  bone]
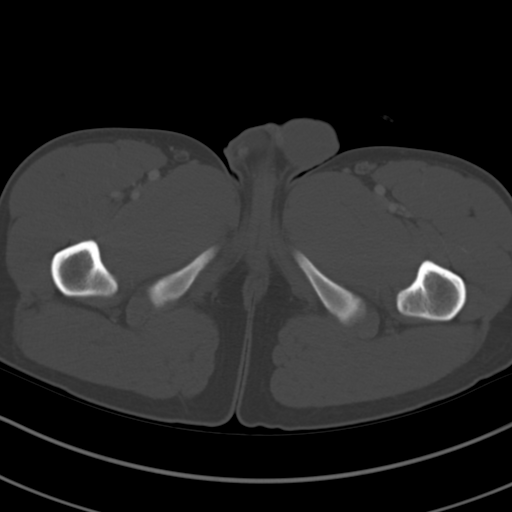
[im 11/94  soft-tissue]
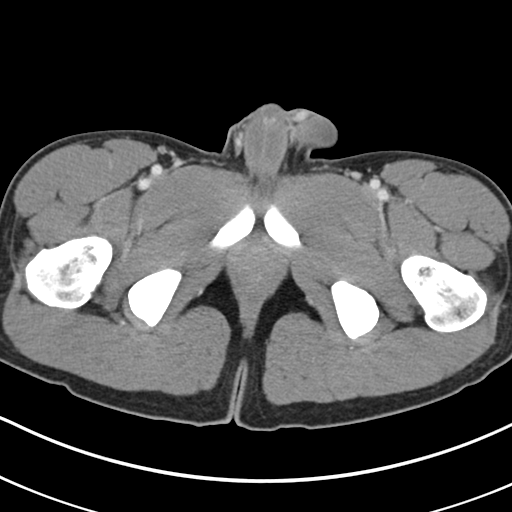
[im 21/94  soft-tissue]
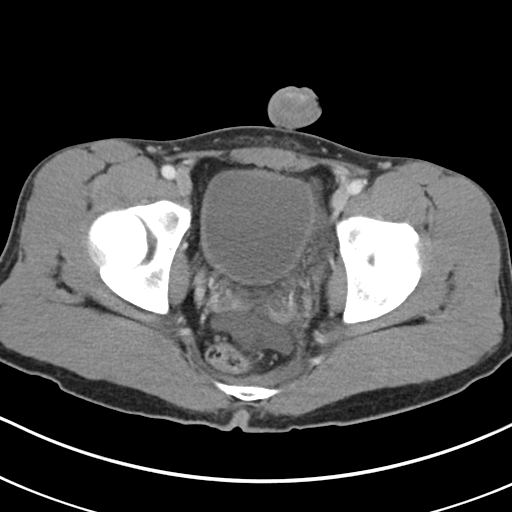
[im 26/94  soft-tissue]
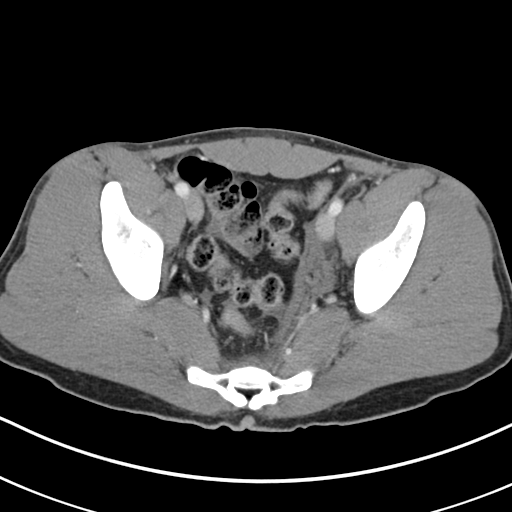
[im 32/94  soft-tissue]
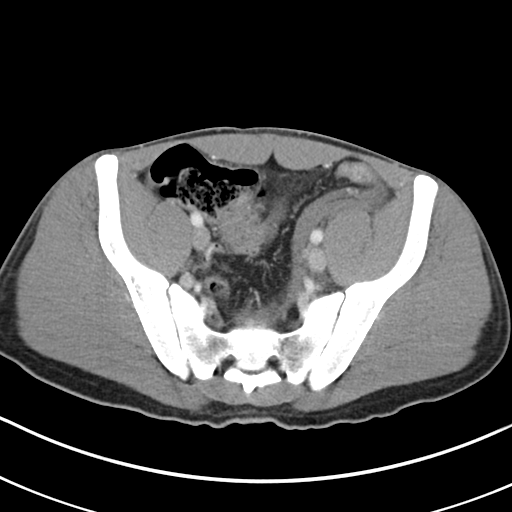
[im 42/94  soft-tissue]
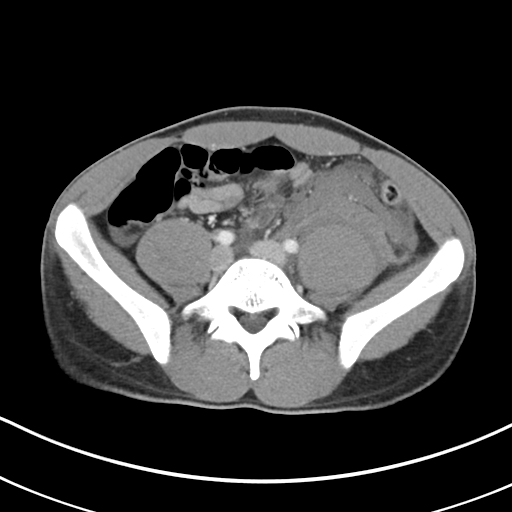
[im 47/94  soft-tissue]
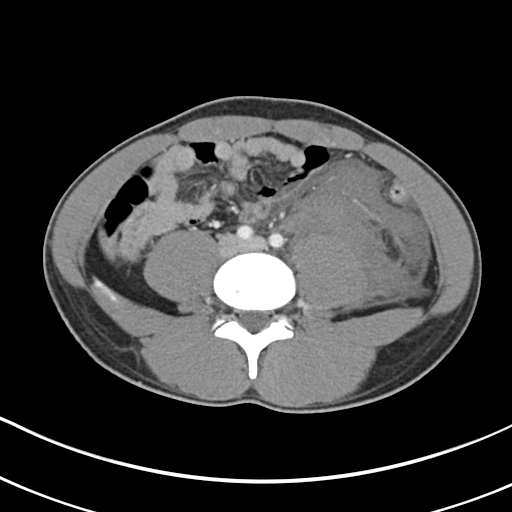
[im 52/94  soft-tissue]
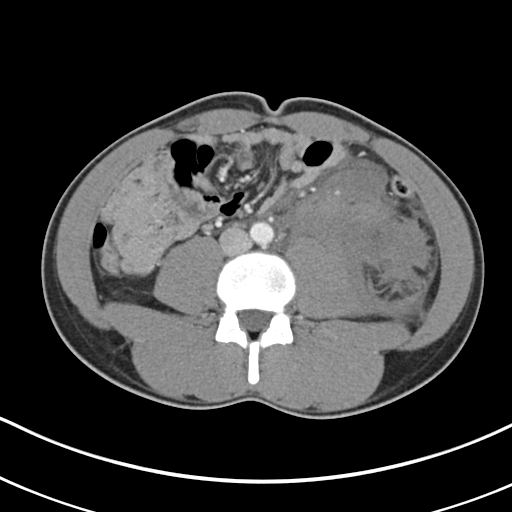
[im 63/94  soft-tissue]
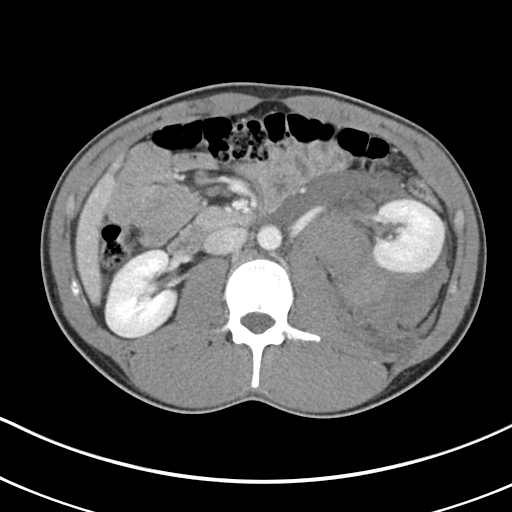
[im 63/94  bone]
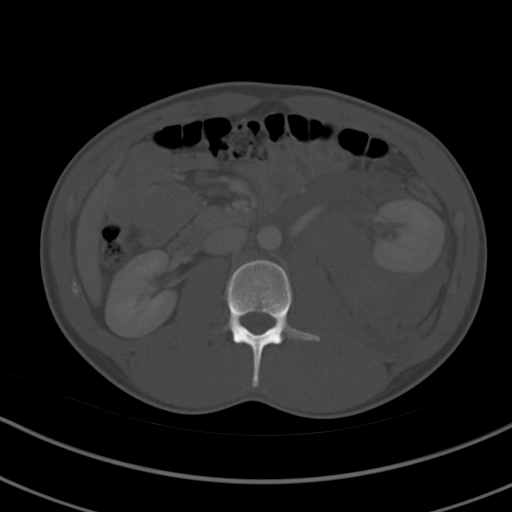
[im 68/94  soft-tissue]
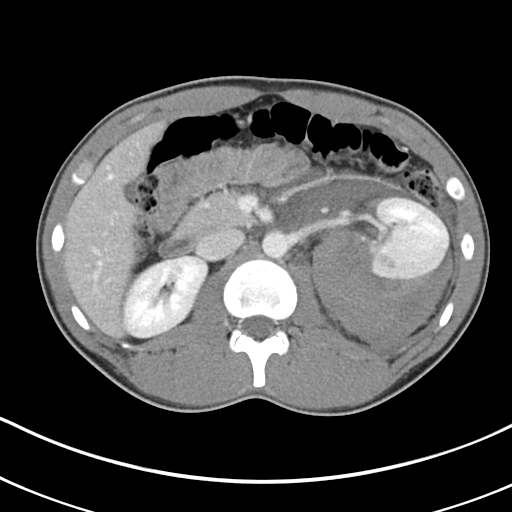
[im 73/94  soft-tissue]
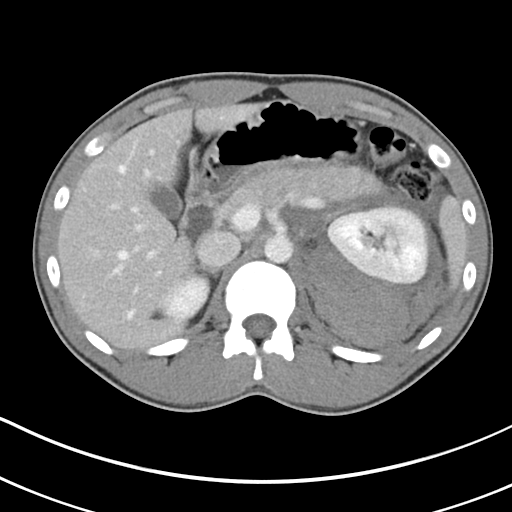
[im 83/94  soft-tissue]
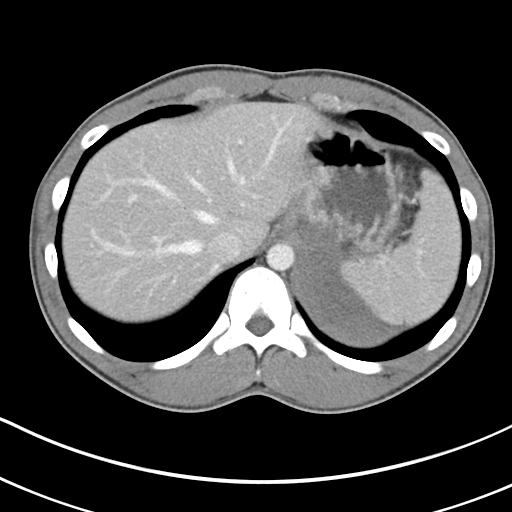
[im 88/94  soft-tissue]
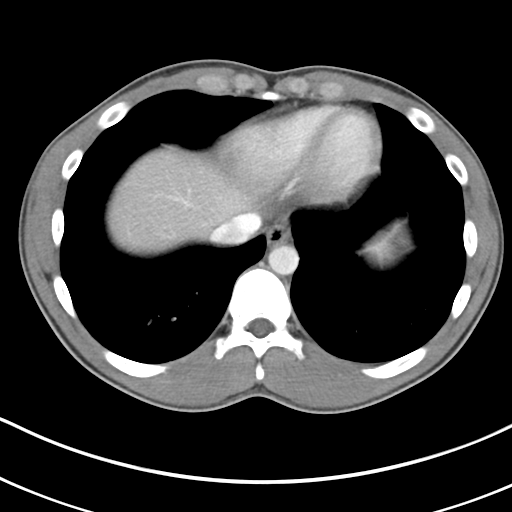

[Series 6: coronal soft tissue · coronal · 0.69mm/px · 3 of 71 slices shown]
[im 24/71  soft-tissue]
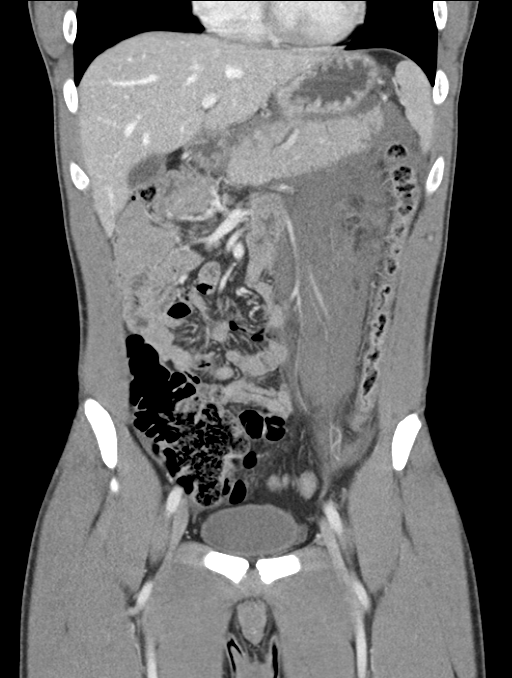
[im 32/71  soft-tissue]
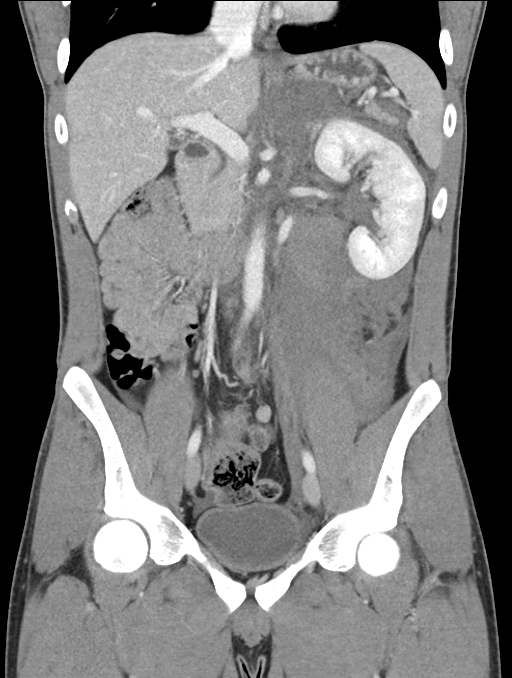
[im 39/71  soft-tissue]
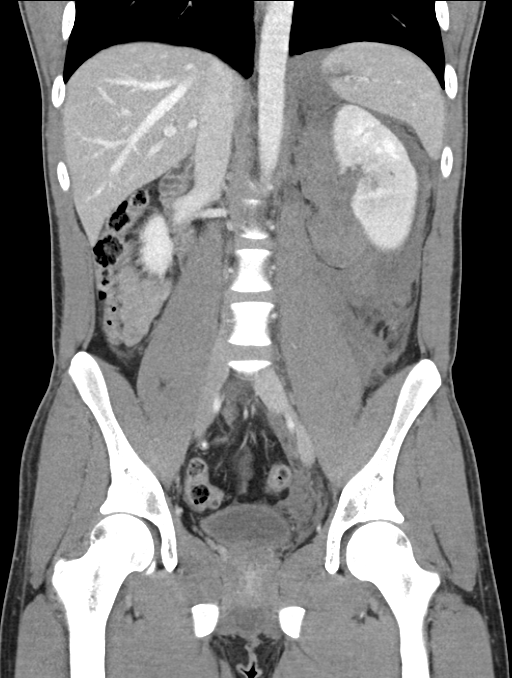

[16 of 46 positions shown; findings below may reference images not displayed]

FINDINGS: Lung bases are clear.  No pericardial fluid.

There is a large perinephric hematoma primarily in the posterior
perinephric space. This large perinephric hematoma elevates the left
kidney. The hematoma measures 4.5 cm in depth posterior to the left
kidney and extends from the iliac fossa the to the upper pole each
kidney.

There is a laceration involving the pole of the left kidney (image
25, series 3). The laceration involves the anterior and posterior
cortex. The posterior the laceration measures 2 cm. Anteriorly
laceration measures 1.5 cm. No clear active extravasation
identified.

Delayed imaging demonstrates no evidence of urine [place] on the left.

The right kidney is normal. There is no evidence solid organ injury
to the liver or spleen.

The abdominal aorta is normal. The left right renal arteries appear
normal.

The bowel is unremarkable.

There is free fluid in the pelvis the posterior cul-de-sac simple
fluid attenuation. Small mild hemorrhage a does extend into the left
pelvis in the retroperitoneal space along the iliac fossa/obturator
space (image 70, series 3. The bladder is intact.

Review of the bone windows demonstrates no evidence of fracture.
IMPRESSION: 1. Grade 3 / 4 left renal laceration with a large perinephric
hematoma elevating the kidney.
2. No clear evidence of active extravasation on exam
3. No evidence of injury to the left renal collecting system or
ureter.
4. No additional evidence of solid organ injury or fracture.
